# Patient Record
Sex: Male | Born: 1990 | Race: Black or African American | Hispanic: No | Marital: Single | Smoking: Never smoker
Health system: Southern US, Community
[De-identification: ages and names within clinical notes are randomized; demographics above are authoritative.]

---

## 2012-06-01 ENCOUNTER — Emergency Department (HOSPITAL_COMMUNITY): Payer: Self-pay

## 2012-06-01 ENCOUNTER — Encounter (HOSPITAL_COMMUNITY): Payer: Self-pay | Admitting: *Deleted

## 2012-06-01 ENCOUNTER — Emergency Department (HOSPITAL_COMMUNITY)
Admission: EM | Admit: 2012-06-01 | Discharge: 2012-06-01 | Disposition: A | Discharge status: Home / Self Care | Payer: Self-pay | Attending: Emergency Medicine | Admitting: Emergency Medicine

## 2012-06-01 DIAGNOSIS — S8990XA Unspecified injury of unspecified lower leg, initial encounter: Secondary | ICD-10-CM | POA: Insufficient documentation

## 2012-06-01 DIAGNOSIS — S99919A Unspecified injury of unspecified ankle, initial encounter: Secondary | ICD-10-CM | POA: Insufficient documentation

## 2012-06-01 DIAGNOSIS — M25562 Pain in left knee: Secondary | ICD-10-CM

## 2012-06-01 DIAGNOSIS — W010XXA Fall on same level from slipping, tripping and stumbling without subsequent striking against object, initial encounter: Secondary | ICD-10-CM | POA: Insufficient documentation

## 2012-06-01 DIAGNOSIS — Y9361 Activity, american tackle football: Secondary | ICD-10-CM | POA: Insufficient documentation

## 2012-06-01 MED ORDER — IBUPROFEN 400 MG PO TABS
800.0000 mg | ORAL_TABLET | Freq: Once | ORAL | Status: AC
  Administered 2012-06-01: 800 mg via ORAL
  Filled 2012-06-01: qty 2

## 2012-06-01 NOTE — Progress Notes (Signed)
Orthopedic Tech Progress Note Patient Details:  Allen Singleton 09/18/1991 [id number]  Ortho Devices Type of Ortho Device: Crutches Ortho Device/Splint Interventions: Application   Cammer, Mickie Bail 06/01/2012, 11:55 AM

## 2012-06-01 NOTE — ED Provider Notes (Signed)
History   This chart was scribed for Gerhard Munch, MD by Charolett Bumpers . The patient was seen in room TR05C/TR05C. Patient's care was started at 11:02.    CSN: [id number]  Arrival date & time 06/01/12  1017   First MD Initiated Contact with Patient 06/01/12 1102      Chief Complaint  Patient presents with  . Knee Pain    (Consider location/radiation/quality/duration/timing/severity/associated sxs/prior treatment) HPI Allen Singleton is a 21 y.o. male who presents to the Emergency Department complaining of constant, moderate left knee pain with an onset of yesterday. Pt states that he was playing football yesterday, when he went down he heard a popping noise in his left knee. Pt states that his left knee buckled to the left side. Pt states that he was able to ambulate but is aggravated with walking. Pt states that he has tried nothing for the pain. Pt denies any other injuries or complaints of pain at this time. Pt states that he is otherwise healthy. Pt denies any prior medical problems or chronic illnesses.   History reviewed. No pertinent past medical history.  History reviewed. No pertinent past surgical history.  History reviewed. No pertinent family history.  History  Substance Use Topics  . Smoking status: Not on file  . Smokeless tobacco: Not on file  . Alcohol Use: No      Review of Systems  Constitutional:       Per HPI, otherwise negative  HENT:       Per HPI, otherwise negative  Eyes: Negative.   Respiratory:       Per HPI, otherwise negative  Cardiovascular:       Per HPI, otherwise negative  Gastrointestinal: Negative for vomiting.  Genitourinary: Negative.   Musculoskeletal: Positive for joint swelling and arthralgias.       Per HPI, otherwise negative  Skin: Negative.   Neurological: Negative for syncope.    Allergies  Review of patient's allergies indicates no known allergies.  Home Medications  No current outpatient prescriptions on  file.  BP 134/76  Pulse 74  Temp 98.2 F (36.8 C) (Oral)  Resp 16  SpO2 99%  Physical Exam  Nursing note and vitals reviewed. Constitutional: He is oriented to person, place, and time. He appears well-developed. No distress.  HENT:  Head: Normocephalic and atraumatic.  Eyes: Conjunctivae and EOM are normal.  Cardiovascular: Normal rate and regular rhythm.   Pulmonary/Chest: Effort normal. No stridor. No respiratory distress.  Abdominal: He exhibits no distension.  Musculoskeletal: Normal range of motion. He exhibits edema and tenderness.       No joint line tenderness on medial left knee. Joint line tenderness laterally on left knee. Extension and flexion of left knee intact. 6 cm area of edema medially on left knee, not erythematous. Axial joint tenderness. Knee is grossly stable.   Neurological: He is alert and oriented to person, place, and time.  Skin: Skin is warm and dry.  Psychiatric: He has a normal mood and affect.    ED Course  Procedures (including critical care time)  DIAGNOSTIC STUDIES:   COORDINATION OF CARE:  11:16-.Discussed planned course of treatment with the patient including Ibuprofen and f/u with orthopedics, who is agreeable at this time.   11:30-Medication Orders: Ibuprofen (Advil, Motrin) tablet 800 mg-once  11:31-Recheck: Informed pt of imaging results and discussed f/u with orthopedics and at home treatment with ice packs, Ibuprofen and elevation. Pt requests a work note. Discussed planned d/c, pt is agreeable  with plan.   Labs Reviewed - No data to display Dg Knee Complete 4 Views Left  06/01/2012  *RADIOLOGY REPORT*  Clinical Data: Knee pain  LEFT KNEE - COMPLETE 4+ VIEW  Comparison: None.  Findings: Four views of the left knee submitted.  No acute fracture or subluxation.  No joint effusion.  No radiopaque foreign body.  IMPRESSION: No acute fracture or subluxation.  Original Report Authenticated By: Natasha Mead, M.D.     No diagnosis  found.    MDM  I personally performed the services described in this documentation, which was scribed in my presence. The recorded information has been reviewed and considered.   This generally well young male presents one day after sustaining a knee injury playing football.  On exam he is in no distress.  The patient's exam was reassuring with demonstration of a stable knee, with appropriate pulses and sensation distally.  Pus, there is low suspicion for transient dislocation, and his x-ray did not demonstrate acute fracture.  The patient's condition is likely secondary to sprain versus cartilage or ligamentous disruption.  He was discharged in stable condition to follow up with orthopedics.    Gerhard Munch, MD 06/01/12 1224

## 2012-06-01 NOTE — ED Notes (Signed)
Pt reports playing football last night and now having left knee pain.

## 2013-07-23 ENCOUNTER — Encounter (HOSPITAL_COMMUNITY): Payer: Self-pay | Admitting: *Deleted

## 2013-07-23 ENCOUNTER — Emergency Department (HOSPITAL_COMMUNITY)
Admission: EM | Admit: 2013-07-23 | Discharge: 2013-07-23 | Disposition: A | Discharge status: Home / Self Care | Payer: Self-pay | Attending: Emergency Medicine | Admitting: Emergency Medicine

## 2013-07-23 DIAGNOSIS — Z113 Encounter for screening for infections with a predominantly sexual mode of transmission: Secondary | ICD-10-CM | POA: Insufficient documentation

## 2013-07-23 DIAGNOSIS — R3915 Urgency of urination: Secondary | ICD-10-CM | POA: Insufficient documentation

## 2013-07-23 DIAGNOSIS — R51 Headache: Secondary | ICD-10-CM | POA: Insufficient documentation

## 2013-07-23 DIAGNOSIS — R300 Dysuria: Secondary | ICD-10-CM | POA: Insufficient documentation

## 2013-07-23 DIAGNOSIS — Z711 Person with feared health complaint in whom no diagnosis is made: Secondary | ICD-10-CM

## 2013-07-23 DIAGNOSIS — R369 Urethral discharge, unspecified: Secondary | ICD-10-CM | POA: Insufficient documentation

## 2013-07-23 LAB — URINALYSIS, ROUTINE W REFLEX MICROSCOPIC
Ketones, ur: NEGATIVE mg/dL
Nitrite: NEGATIVE
Specific Gravity, Urine: 1.024 (ref 1.005–1.030)
pH: 7.5 (ref 5.0–8.0)

## 2013-07-23 LAB — URINE MICROSCOPIC-ADD ON

## 2013-07-23 MED ORDER — LIDOCAINE HCL (PF) 1 % IJ SOLN
2.0000 mL | Freq: Once | INTRAMUSCULAR | Status: AC
  Administered 2013-07-23: 2 mL

## 2013-07-23 MED ORDER — IBUPROFEN 800 MG PO TABS
800.0000 mg | ORAL_TABLET | Freq: Once | ORAL | Status: AC
  Administered 2013-07-23: 800 mg via ORAL
  Filled 2013-07-23: qty 1

## 2013-07-23 MED ORDER — AZITHROMYCIN 250 MG PO TABS
1000.0000 mg | ORAL_TABLET | Freq: Once | ORAL | Status: AC
  Administered 2013-07-23: 1000 mg via ORAL
  Filled 2013-07-23: qty 4

## 2013-07-23 MED ORDER — LIDOCAINE HCL (PF) 1 % IJ SOLN
INTRAMUSCULAR | Status: AC
  Administered 2013-07-23: 2 mL
  Filled 2013-07-23: qty 5

## 2013-07-23 MED ORDER — CEFTRIAXONE SODIUM 250 MG IJ SOLR
250.0000 mg | Freq: Once | INTRAMUSCULAR | Status: AC
  Administered 2013-07-23: 250 mg via INTRAMUSCULAR
  Filled 2013-07-23: qty 250

## 2013-07-23 NOTE — ED Notes (Signed)
Pt presents with a headache for the past 2 days that comes and goes- states it is hurting on both sides.  Pt also admits to frequent and painful urination- also admits to discharge from his penis for the past 2 days.  Neuro exam negative- alert and oriented X 4.

## 2013-07-23 NOTE — ED Notes (Signed)
Headache for 2 days.  No previous history.  No meds taken for the same

## 2013-07-23 NOTE — ED Provider Notes (Signed)
CSN: [id number]     Arrival date & time 07/23/13  1740 History   First MD Initiated Contact with Patient 07/23/13 2056     Chief Complaint  Patient presents with  . Headache   (Consider location/radiation/quality/duration/timing/severity/associated sxs/prior Treatment) The history is provided by the patient and medical records. No language interpreter was used.    Allen Singleton is a 22 y.o. male  with no known medical Hx presents to the Emergency Department complaining of gradual, persistent, progressively worsening headache onset 2 days ago. Pt describes the pain as throbbing, bitemporal, rated at a 7/10, and nonradiating.  Pt reports that light makes his headache worse and nothing makes it better.  He has not tried to take anything for his headache.  Pt reports a hx of migraine headache and today's symptoms are the same.  He reports that he usually takes tylenol for this but did not feel like trying any this time. He has associated "hot flashes" but has not measured his temperature or has chills.   Pt denies fever, chills, headache, neck pain, chest pain, SOB, palpitations, abd pain, N/V/D, weakness, dizziness, syncope.     Pt has had 3 sexual partners in the last 6 mos, all male.  Pt has not used a condom recently and after a new sexual encounter he began to have dysuria and penile discharge.  Pt denies hematuria, testicular or penile pain.  Pt reports discharge is clear.     History reviewed. No pertinent past medical history. History reviewed. No pertinent past surgical history. No family history on file. History  Substance Use Topics  . Smoking status: Never Smoker   . Smokeless tobacco: Not on file  . Alcohol Use: No    Review of Systems  Constitutional: Negative for fever, diaphoresis, appetite change, fatigue and unexpected weight change.  HENT: Negative for mouth sores and neck stiffness.   Eyes: Negative for visual disturbance.  Respiratory: Negative for cough, chest  tightness, shortness of breath and wheezing.   Cardiovascular: Negative for chest pain.  Gastrointestinal: Negative for nausea, vomiting, abdominal pain, diarrhea and constipation.  Endocrine: Negative for polydipsia, polyphagia and polyuria.  Genitourinary: Positive for dysuria, urgency and discharge. Negative for frequency, hematuria, decreased urine volume, penile swelling, scrotal swelling, penile pain and testicular pain.  Musculoskeletal: Negative for back pain.  Skin: Negative for rash.  Allergic/Immunologic: Negative for immunocompromised state.  Neurological: Positive for headaches. Negative for syncope and light-headedness.  Hematological: Does not bruise/bleed easily.  Psychiatric/Behavioral: Negative for sleep disturbance. The patient is not nervous/anxious.     Allergies  Review of patient's allergies indicates no known allergies.  Home Medications  No current outpatient prescriptions on file. BP 142/91  Pulse 54  Temp(Src) 97.5 F (36.4 C) (Oral)  Resp 16  SpO2 100% Physical Exam  Nursing note and vitals reviewed. Constitutional: He is oriented to person, place, and time. He appears well-developed and well-nourished. No distress.  HENT:  Head: Normocephalic and atraumatic.  Right Ear: Hearing, tympanic membrane, external ear and ear canal normal.  Left Ear: Hearing, tympanic membrane, external ear and ear canal normal.  Nose: Nose normal. No mucosal edema or rhinorrhea. Right sinus exhibits no maxillary sinus tenderness and no frontal sinus tenderness. Left sinus exhibits no maxillary sinus tenderness and no frontal sinus tenderness.  Mouth/Throat: Uvula is midline, oropharynx is clear and moist and mucous membranes are normal. Mucous membranes are not dry. No edematous. No oropharyngeal exudate, posterior oropharyngeal edema, posterior oropharyngeal erythema or tonsillar abscesses.  Eyes: Conjunctivae and EOM are normal. Pupils are equal, round, and reactive to light.  No scleral icterus.  Neck: Normal range of motion. Neck supple.  Cardiovascular: Normal rate, regular rhythm, normal heart sounds and intact distal pulses.   No murmur heard. Pulmonary/Chest: Effort normal and breath sounds normal. No respiratory distress. He has no wheezes. He has no rales. He exhibits no tenderness.  Abdominal: Soft. Bowel sounds are normal. He exhibits no distension. There is no tenderness. There is no rebound and no guarding. Hernia confirmed negative in the right inguinal area and confirmed negative in the left inguinal area.  Genitourinary: Testes normal. Right testis shows no mass, no swelling and no tenderness. Right testis is descended. Cremasteric reflex is not absent on the right side. Left testis shows no mass, no swelling and no tenderness. Left testis is descended. Cremasteric reflex is not absent on the left side. Circumcised. No phimosis, paraphimosis, hypospadias, penile erythema or penile tenderness. Discharge (thick, green, malodorous) found.  Musculoskeletal: Normal range of motion. He exhibits no edema and no tenderness.  Lymphadenopathy:    He has no cervical adenopathy.       Right: No inguinal adenopathy present.       Left: No inguinal adenopathy present.  Neurological: He is alert and oriented to person, place, and time. He has normal reflexes. No cranial nerve deficit. He exhibits normal muscle tone. Coordination normal.  Speech is clear and goal oriented, follows commands Cranial nerves III - XII without deficit, no facial droop Normal strength in upper and lower extremities bilaterally, strong and equal grip strength Sensation normal to light and sharp touch Moves extremities without ataxia, coordination intact Normal finger to nose and rapid alternating movements Neg romberg, no pronator drift Normal gait Normal heel-shin and balance   Skin: Skin is warm and dry. No rash noted. He is not diaphoretic. No erythema.  Psychiatric: He has a normal  mood and affect. His behavior is normal. Judgment and thought content normal.    ED Course  Procedures (including critical care time) Labs Review Labs Reviewed  URINALYSIS, ROUTINE W REFLEX MICROSCOPIC - Abnormal; Notable for the following:    APPearance TURBID (*)    Leukocytes, UA MODERATE (*)    All other components within normal limits  URINE MICROSCOPIC-ADD ON - Abnormal; Notable for the following:    Bacteria, UA MANY (*)    All other components within normal limits  GC/CHLAMYDIA PROBE AMP  URINE CULTURE  RPR  HIV ANTIBODY (ROUTINE TESTING)   Imaging Review No results found.  MDM   1. Concern about STD in male without diagnosis   2. Headache      Darreld Gruszka presents with headache and penile drainage.  Pt HA treated and improved while in ED.  Presentation is like pts typical HA and non concerning for [facility], ICH, Meningitis, or temporal arteritis. Pt is afebrile with no focal neuro deficits, nuchal rigidity, or change in vision. Pt is to follow up with PCP to discuss prophylactic medication; recommend use of tylenol and/or ibuprofen in the future.   Presents for STD screen and penile discharge.  STD cultures obtained including gonorrhea Chlamydia, RPR and HIV. Pt treated prophylactically with Azithromycin and Rocephin.  Patient to be discharged with instructions to follow up with PCP. Discussed importance of using protection when sexually active. Pt understands that they have GC/Chlamydia cultures pending and that they will need to inform all sexual partners if results return positive. I have also discussed reasons to  return immediately to the ER. Patient expresses understanding and agrees with plan.  It has been determined that no acute conditions requiring further emergency intervention are present at this time. The patient/guardian have been advised of the diagnosis and plan. We have discussed signs and symptoms that warrant return to the ED, such as changes or worsening in  symptoms.   Vital signs are stable at discharge.   BP 142/91  Pulse 54  Temp(Src) 97.5 F (36.4 C) (Oral)  Resp 16  SpO2 100%  Patient/guardian has voiced understanding and agreed to follow-up with the PCP or specialist.       Dierdre Forth, PA-C 07/23/13 2227

## 2013-07-24 LAB — HIV ANTIBODY (ROUTINE TESTING W REFLEX): HIV: NONREACTIVE

## 2013-07-24 NOTE — ED Provider Notes (Signed)
Medical screening examination/treatment/procedure(s) were performed by non-physician practitioner and as supervising physician I was immediately available for consultation/collaboration.   Shanna Cisco, MD 07/24/13 [phone]

## 2013-07-25 LAB — URINE CULTURE: Culture: NO GROWTH

## 2013-07-27 ENCOUNTER — Telehealth (HOSPITAL_COMMUNITY): Payer: Self-pay | Admitting: Emergency Medicine

## 2013-07-27 NOTE — ED Notes (Signed)
Patient has +Gonorrhea. °

## 2013-07-27 NOTE — ED Notes (Signed)
+  Gonorrhea. Patient treated with Rocephin and Zithromax. [employer] faxed. 

## 2013-08-01 ENCOUNTER — Telehealth (HOSPITAL_COMMUNITY): Payer: Self-pay | Admitting: Emergency Medicine

## 2013-08-01 NOTE — ED Notes (Signed)
Unable to contact patient via phone. Sent letter. °

## 2014-09-17 ENCOUNTER — Encounter (HOSPITAL_COMMUNITY): Payer: Self-pay | Admitting: *Deleted

## 2014-09-17 ENCOUNTER — Emergency Department (INDEPENDENT_AMBULATORY_CARE_PROVIDER_SITE_OTHER)
Admission: EM | Admit: 2014-09-17 | Discharge: 2014-09-17 | Disposition: A | Discharge status: Home / Self Care | Payer: PRIVATE HEALTH INSURANCE | Source: Home / Self Care | Attending: Family Medicine | Admitting: Family Medicine

## 2014-09-17 DIAGNOSIS — D170 Benign lipomatous neoplasm of skin and subcutaneous tissue of head, face and neck: Secondary | ICD-10-CM

## 2014-09-17 NOTE — ED Provider Notes (Signed)
CSN: [id number]     Arrival date & time 09/17/14  1109 History   First MD Initiated Contact with Patient 09/17/14 1134     Chief Complaint  Patient presents with  . Abscess   (Consider location/radiation/quality/duration/timing/severity/associated sxs/prior Treatment) HPI       23 year old male presents for evaluation of a swollen area on his left ear. This is been present for about 2 months, getting gradually larger. It is now about 7 mm diameter. It is soft, swollen, and nontender. He denies any drainage. He has had something similar to this before, more onto his face, that was very swollen and tender and had to be drained. He denies any systemic symptoms.  History reviewed. No pertinent past medical history. History reviewed. No pertinent past surgical history. History reviewed. No pertinent family history. History  Substance Use Topics  . Smoking status: Never Smoker   . Smokeless tobacco: Not on file  . Alcohol Use: No    Review of Systems  HENT:       See history of present illness regarding lesion on the face  All other systems reviewed and are negative.   Allergies  Review of patient's allergies indicates no known allergies.  Home Medications   Prior to Admission medications   Not on File   BP 138/73 mmHg  Pulse 63  Temp(Src) 98.5 F (36.9 C) (Oral)  Resp 12  SpO2 97% Physical Exam  Constitutional: He is oriented to person, place, and time. He appears well-developed and well-nourished. No distress.  HENT:  Head: Normocephalic.  Ears:  Pulmonary/Chest: Effort normal. No respiratory distress.  Neurological: He is alert and oriented to person, place, and time. Coordination normal.  Skin: Skin is warm and dry. No rash noted. He is not diaphoretic.  Psychiatric: He has a normal mood and affect. Judgment normal.  Nursing note and vitals reviewed.   ED Course  Procedures (including critical care time) Labs Review Labs Reviewed - No data to display  Imaging  Review No results found.   MDM   1. Lipoma of face    Discussed with patient his options, he would like to get this removed. He will be referred to general surgery for removal. Return precautions discussed with the patient.       Liam Graham, PA-C 09/17/14 1144

## 2014-09-17 NOTE — ED Notes (Signed)
Pt  Has  A  Swollen area above the  l  Ear  That  Has  Been there  For several    Months    - he  Reports       He  Has  Had one in past    -he  denys  Any  Other  Symptoms

## 2014-09-17 NOTE — Discharge Instructions (Signed)
Lipoma  A lipoma is a noncancerous (benign) tumor composed of fat cells. They are usually found under the skin (subcutaneous). A lipoma may occur in any tissue of the body that contains fat. Common areas for lipomas to appear include the back, shoulders, buttocks, and thighs. Lipomas are a very common soft tissue growth. They are soft and grow slowly. Most problems caused by a lipoma depend on where it is growing.  DIAGNOSIS   A lipoma can be diagnosed with a physical exam. These tumors rarely become cancerous, but radiographic studies can help determine this for certain. Studies used may include:  · Computerized X-ray scans (CT or CAT scan).  · Computerized magnetic scans (MRI).  TREATMENT   Small lipomas that are not causing problems may be watched. If a lipoma continues to enlarge or causes problems, removal is often the best treatment. Lipomas can also be removed to improve appearance. Surgery is done to remove the fatty cells and the surrounding capsule. Most often, this is done with medicine that numbs the area (local anesthetic). The removed tissue is examined under a microscope to make sure it is not cancerous. Keep all follow-up appointments with your caregiver.  SEEK MEDICAL CARE IF:   · The lipoma becomes larger or hard.  · The lipoma becomes painful, red, or increasingly swollen. These could be signs of infection or a more serious condition.  Document Released: 10/13/2002 Document Revised: 01/15/2012 Document Reviewed: 03/25/2010  Exit[employer]® Patient Information ©2015 [employer]. This information is not intended to replace advice given to you by your health care provider. Make sure you discuss any questions you have with your health care provider.

## 2015-04-09 ENCOUNTER — Encounter: Payer: Self-pay | Admitting: Emergency Medicine

## 2015-04-09 ENCOUNTER — Ambulatory Visit (INDEPENDENT_AMBULATORY_CARE_PROVIDER_SITE_OTHER): Payer: Worker's Compensation | Admitting: Emergency Medicine

## 2015-04-09 ENCOUNTER — Ambulatory Visit: Payer: Worker's Compensation

## 2015-04-09 VITALS — BP 122/60 | Temp 98.7°F | Resp 17 | Ht 69.0 in | Wt 203.0 lb

## 2015-04-09 DIAGNOSIS — M25572 Pain in left ankle and joints of left foot: Secondary | ICD-10-CM

## 2015-04-09 MED ORDER — NAPROXEN SODIUM 550 MG PO TABS: 550.0000 mg | ORAL_TABLET | Freq: Two times a day (BID) | ORAL | Status: DC

## 2015-04-09 NOTE — Patient Instructions (Signed)

## 2015-04-09 NOTE — Progress Notes (Signed)
Allen Singleton Mar 03, 1991 24 y.o.   Chief Complaint  Patient presents with  . Ankle Pain    heavy door hit nkle last night at work  . Ankle Injury    swollen     Date of Injury: 04/08/2015  History of Present Illness:  Presents for evaluation of work-related complaint. He stated that last night while at work a door closed on its left ankle and heel causing him to have suffered an inversion injury. He said that he felt and heard a pop in his ankle. Since that time has been unable to bear weight comfortably. He is experiencing some swelling and tenderness of the ankle and medial forefoot.  Said the pain is diminished by elevation and cold application.  He's had no improvement with over-the-counter medication.  ROS  Review of Systems  Constitutional: Negative for fever, chills and fatigue.  HENT: Negative for congestion, ear pain, hearing loss, postnasal drip, rhinorrhea and sinus pressure.   Eyes: Negative for discharge and redness.  Respiratory: Negative for cough, shortness of breath and wheezing.   Cardiovascular: Negative for chest pain and leg swelling.  Gastrointestinal: Negative for nausea, vomiting, abdominal pain, constipation and blood in stool.  Genitourinary: Negative for dysuria, urgency and frequency.  Musculoskeletal: Negative for neck stiffness.  Skin: Negative for rash.  Neurological: Negative for seizures, weakness and headaches.    No Known Allergies   Current medications reviewed and updated. Past medical history, family history, social history have been reviewed and updated.   Physical Exam  Constitutional: He is well-developed, well-nourished, and in no distress.  HENT:  Head: Normocephalic and atraumatic.  Eyes: Conjunctivae are normal. Pupils are equal, round, and reactive to light.  Neck: Normal range of motion. Neck supple.  Cardiovascular: Normal rate and regular rhythm.   Pulmonary/Chest: Breath sounds normal. No respiratory distress.   Abdominal: Soft.  Musculoskeletal:       Left ankle: He exhibits swelling and deformity. He exhibits normal range of motion, no ecchymosis and normal pulse. Tenderness. Lateral malleolus tenderness found. Achilles tendon normal.     Assessment and Plan:   [facility] reading (PRIMARY) by  Dr. Ouida Sills. Negative foot and ankle.  Soft tissue injury the ankle.   He was placed on Anaprox for pain. He'll elevate and ice his ankle as needed as able He was given a work note for light duty. He'll follow-up in a week.Marland Kitchen He was placed in a walker boot.

## 2015-04-15 ENCOUNTER — Ambulatory Visit (INDEPENDENT_AMBULATORY_CARE_PROVIDER_SITE_OTHER): Payer: Worker's Compensation | Admitting: Emergency Medicine

## 2015-04-15 VITALS — BP 118/70 | HR 55 | Temp 98.6°F | Resp 17 | Ht 69.5 in | Wt 152.4 lb

## 2015-04-15 DIAGNOSIS — M25572 Pain in left ankle and joints of left foot: Secondary | ICD-10-CM

## 2015-04-15 MED ORDER — IBUPROFEN 600 MG PO TABS: 600.0000 mg | ORAL_TABLET | Freq: Three times a day (TID) | ORAL | Status: AC | PRN

## 2015-04-15 NOTE — Patient Instructions (Signed)

## 2015-04-15 NOTE — Progress Notes (Signed)
Subjective:  Patient ID: Allen Singleton, male    DOB: Nov 16, 1990  Age: 24 y.o. MRN: [id number]  CC: Follow-up   HPI Lamine Laton presents  . He was injured by a door that closed on his foot a week ago. His interval history is one of improvement although he still complains quite bitterly of pain in the forefoot. His ankle joint is free complaint. He has no swelling or ecchymosis. He never did get his naproxen due to cost issues. He denies any other complaint.  Outpatient Prescriptions Prior to Visit  Medication Sig Dispense Refill  . naproxen sodium (ANAPROX DS) 550 MG tablet Take 1 tablet (550 mg total) by mouth 2 (two) times daily with a meal. (Patient not taking: Reported on 04/15/2015) 40 tablet 0   No facility-administered medications prior to visit.    History   Social History  . Marital Status: Single    Spouse Name: N/A  . Number of Children: N/A  . Years of Education: N/A   Social History Main Topics  . Smoking status: Never Smoker   . Smokeless tobacco: Not on file  . Alcohol Use: No  . Drug Use: No  . Sexual Activity: Not on file   Other Topics Concern  . None   Social History Narrative    History reviewed. No pertinent family history.  History reviewed. No pertinent past medical history.   Review of Systems  Constitutional: Negative for fever, chills and appetite change.  HENT: Negative for congestion, ear pain, postnasal drip, sinus pressure and sore throat.   Eyes: Negative for pain and redness.  Respiratory: Negative for cough, shortness of breath and wheezing.   Cardiovascular: Negative for leg swelling.  Gastrointestinal: Negative for nausea, vomiting, abdominal pain, diarrhea, constipation and blood in stool.  Endocrine: Negative for polyuria.  Genitourinary: Negative for dysuria, urgency, frequency and flank pain.  Musculoskeletal: Negative for gait problem.  Skin: Negative for rash.  Neurological: Negative for weakness and headaches.    Psychiatric/Behavioral: Negative for confusion and decreased concentration. The patient is not nervous/anxious.     Objective:  BP 118/70 mmHg  Pulse 55  Temp(Src) 98.6 F (37 C) (Oral)  Resp 17  Ht 5' 9.5" (1.765 m)  Wt 152 lb 6.4 oz (69.128 kg)  BMI 22.19 kg/m2  SpO2 98%  BP Readings from Last 3 Encounters:  04/15/15 118/70  04/09/15 122/60  09/17/14 138/73    Wt Readings from Last 3 Encounters:  04/15/15 152 lb 6.4 oz (69.128 kg)  04/09/15 203 lb (92.08 kg)    Physical Exam  Constitutional: He is oriented to person, place, and time. He appears well-developed and well-nourished.  HENT:  Head: Normocephalic and atraumatic.  Eyes: Conjunctivae are normal. Pupils are equal, round, and reactive to light.  Pulmonary/Chest: Effort normal.  Musculoskeletal: He exhibits no edema.       Right shoulder: He exhibits tenderness. He exhibits normal range of motion, no swelling and no deformity.  Neurological: He is alert and oriented to person, place, and time.  Skin: Skin is dry.  Psychiatric: He has a normal mood and affect. His behavior is normal. Thought content normal.    No results found for: WBC, HGB, HCT, PLT, GLUCOSE, CHOL, TRIG, HDL, LDLDIRECT, LDLCALC, ALT, AST, NA, K, CL, CREATININE, BUN, CO2, TSH, PSA, INR, GLUF, HGBA1C, MICROALBUR    .  Assessment & Plan:   Nahuel was seen today for follow-up.  Diagnoses and all orders for this visit:  Pain in joint,  ankle and foot, left  Other orders -     ibuprofen (ADVIL,MOTRIN) 600 MG tablet; Take 1 tablet (600 mg total) by mouth every 8 (eight) hours as needed.   I am having Mr. Stenberg start on ibuprofen. I am also having him maintain his naproxen sodium.  Meds ordered this encounter  Medications  . ibuprofen (ADVIL,MOTRIN) 600 MG tablet    Sig: Take 1 tablet (600 mg total) by mouth every 8 (eight) hours as needed.    Dispense:  30 tablet    Refill:  0    Appropriate red flag conditions were discussed with  the patient as well as actions that should be taken.  Patient expressed his understanding.  Follow-up: Return in about 1 week (around 04/22/2015).  Roselee Culver, MD

## 2015-04-22 ENCOUNTER — Ambulatory Visit (INDEPENDENT_AMBULATORY_CARE_PROVIDER_SITE_OTHER): Payer: Worker's Compensation | Admitting: Physician Assistant

## 2015-04-22 VITALS — BP 120/74 | HR 61 | Temp 98.5°F | Resp 16 | Ht 69.5 in | Wt 151.0 lb

## 2015-04-22 DIAGNOSIS — M25572 Pain in left ankle and joints of left foot: Secondary | ICD-10-CM

## 2015-04-22 NOTE — Progress Notes (Signed)
   Subjective:    Patient ID: Allen Singleton, male    DOB: [birth year], 24 y.o.   MRN: [id number]  Chief Complaint  Patient presents with  . Follow-up    W/C Lt. foot injury   Medications, allergies, past medical history, surgical history, family history, social history and problem list reviewed and updated.  HPI  24 yom returns for evaluation for workers comp case.   Seen here 6/3 and 6/9 after sustaining left ankle/forefoot injury at work when door shut on his foot. Xrays neg on 6/3. Was placed in walker boot 6/3 and returned to work light duty. When seen on 6/9 had continued pain so had scheduled ibuprofen added to his naproxen. Was again given return to work with restrictions.   Today he returns without any further pain or issues. Walking and working as normal. No walker boot or brace. Taking naproxen qd.   Review of Systems No fevers, chills.     Objective:   Physical Exam  Constitutional: He is oriented to person, place, and time. He appears well-developed and well-nourished.  Non-toxic appearance. He does not have a sickly appearance. He does not appear ill. No distress.  BP 120/74 mmHg  Pulse 61  Temp(Src) 98.5 F (36.9 C) (Oral)  Resp 16  Ht 5' 9.5" (1.765 m)  Wt 151 lb (68.493 kg)  BMI 21.99 kg/m2  SpO2 98%   Musculoskeletal:       Left ankle: Normal. He exhibits normal range of motion, no swelling and no ecchymosis. No tenderness. Achilles tendon normal.       Left foot: Normal. There is normal range of motion, no tenderness and no bony tenderness.  Neurological: He is alert and oriented to person, place, and time.      Assessment & Plan:   24 yom returns for evaluation for workers comp case.   Pain in joint, ankle and foot, left --normal exam, no further pain or complaints --return to work without restrictions  Julieta Gutting, PA-C Physician Assistant-Certified [facility]  04/22/2015 3:27 PM

## 2015-11-09 IMAGING — CR DG ANKLE COMPLETE 3+V*L*
4 series · 4 of 4 positions shown · non-contrast
Comparison: None.

CLINICAL DATA: Pain after hitting door

EXAM:
LEFT ANKLE COMPLETE - 3+ VIEW

[AP]
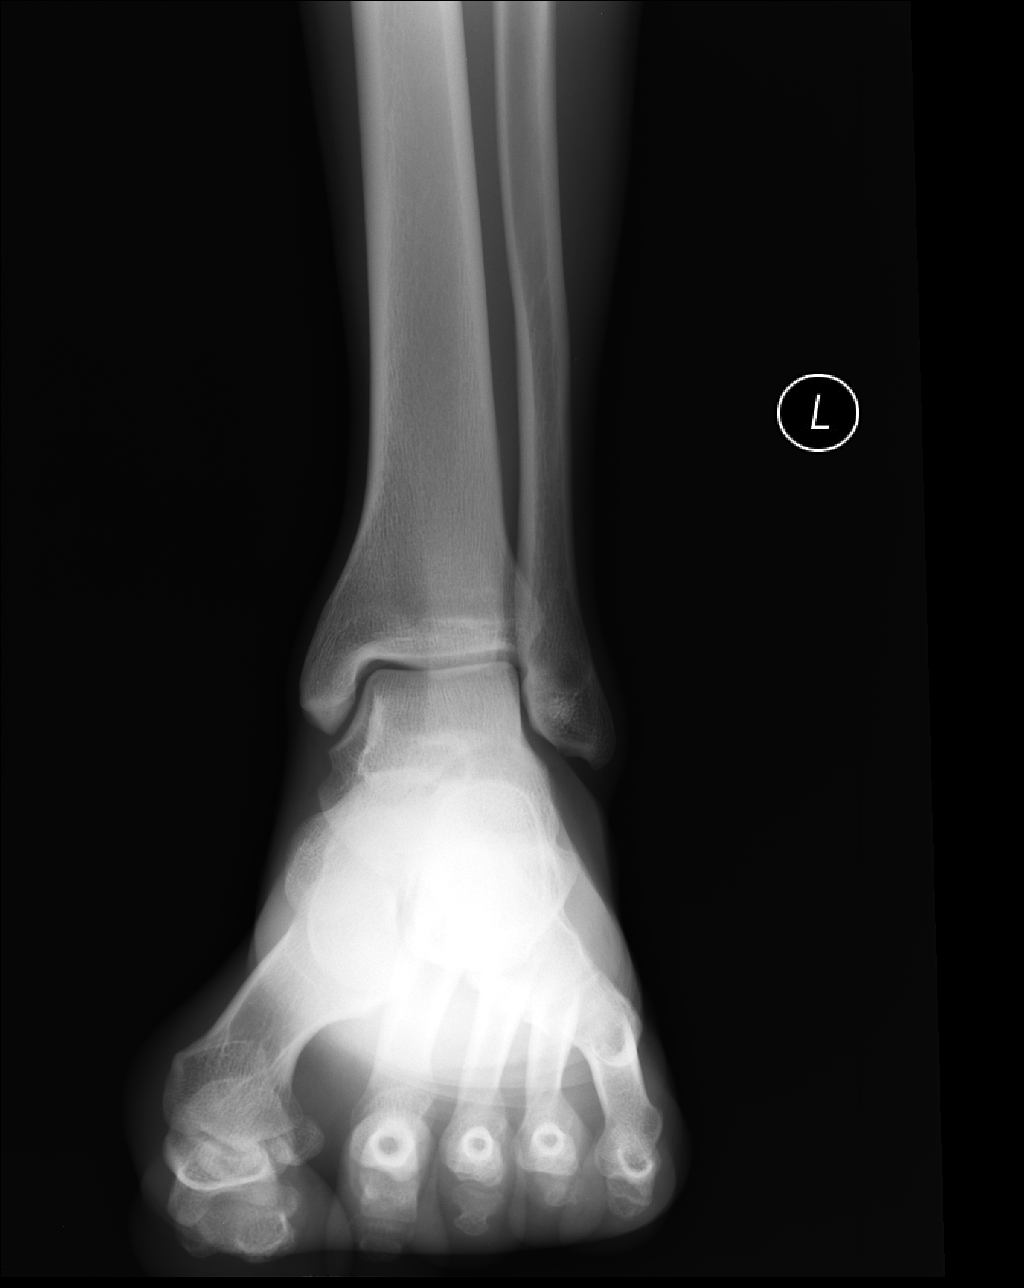

[ap obl int rot]
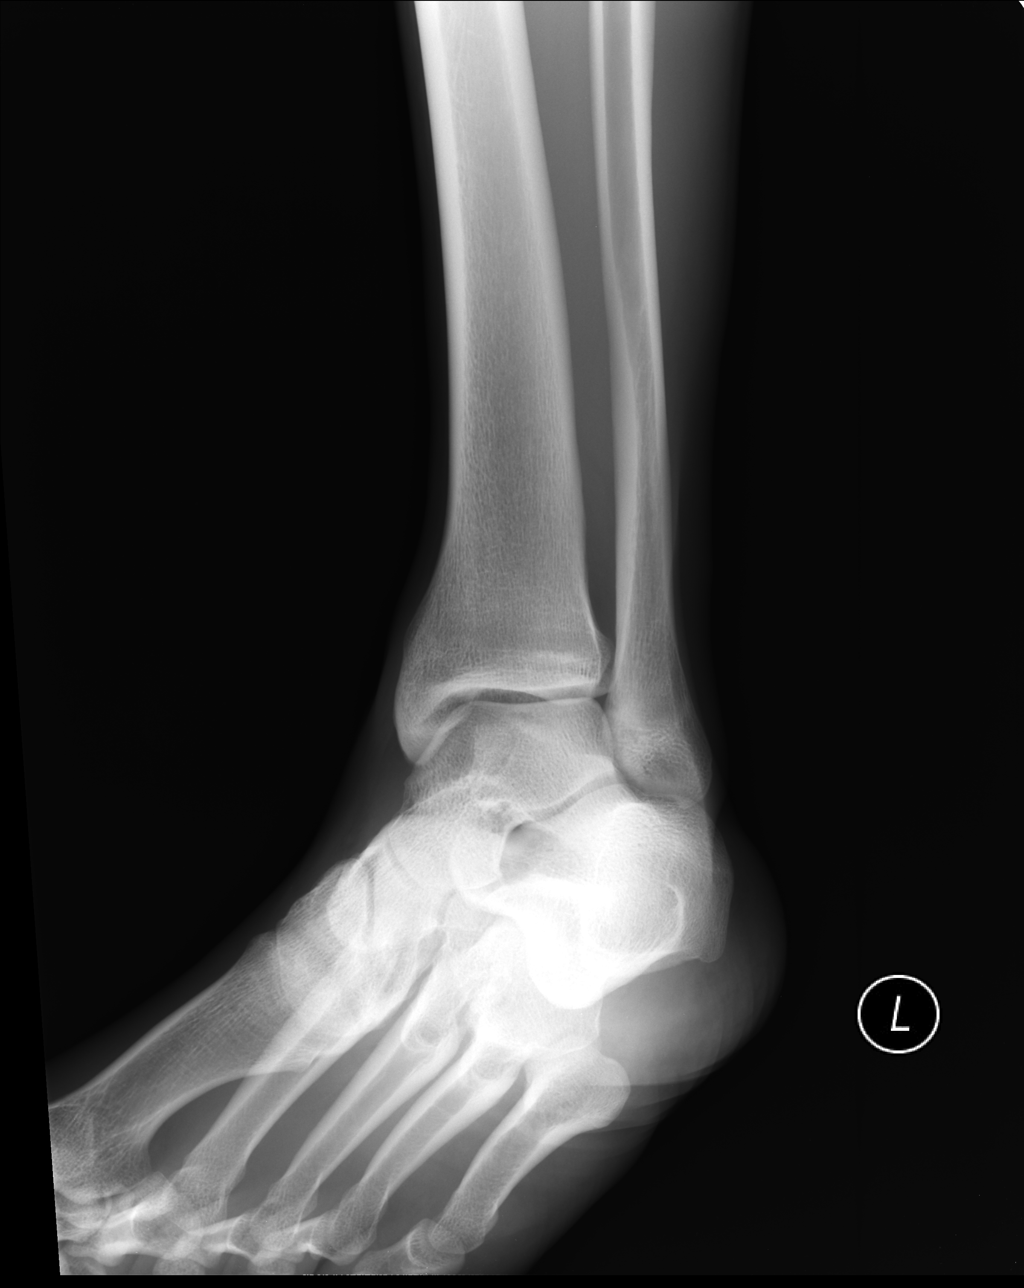

[medial obl]
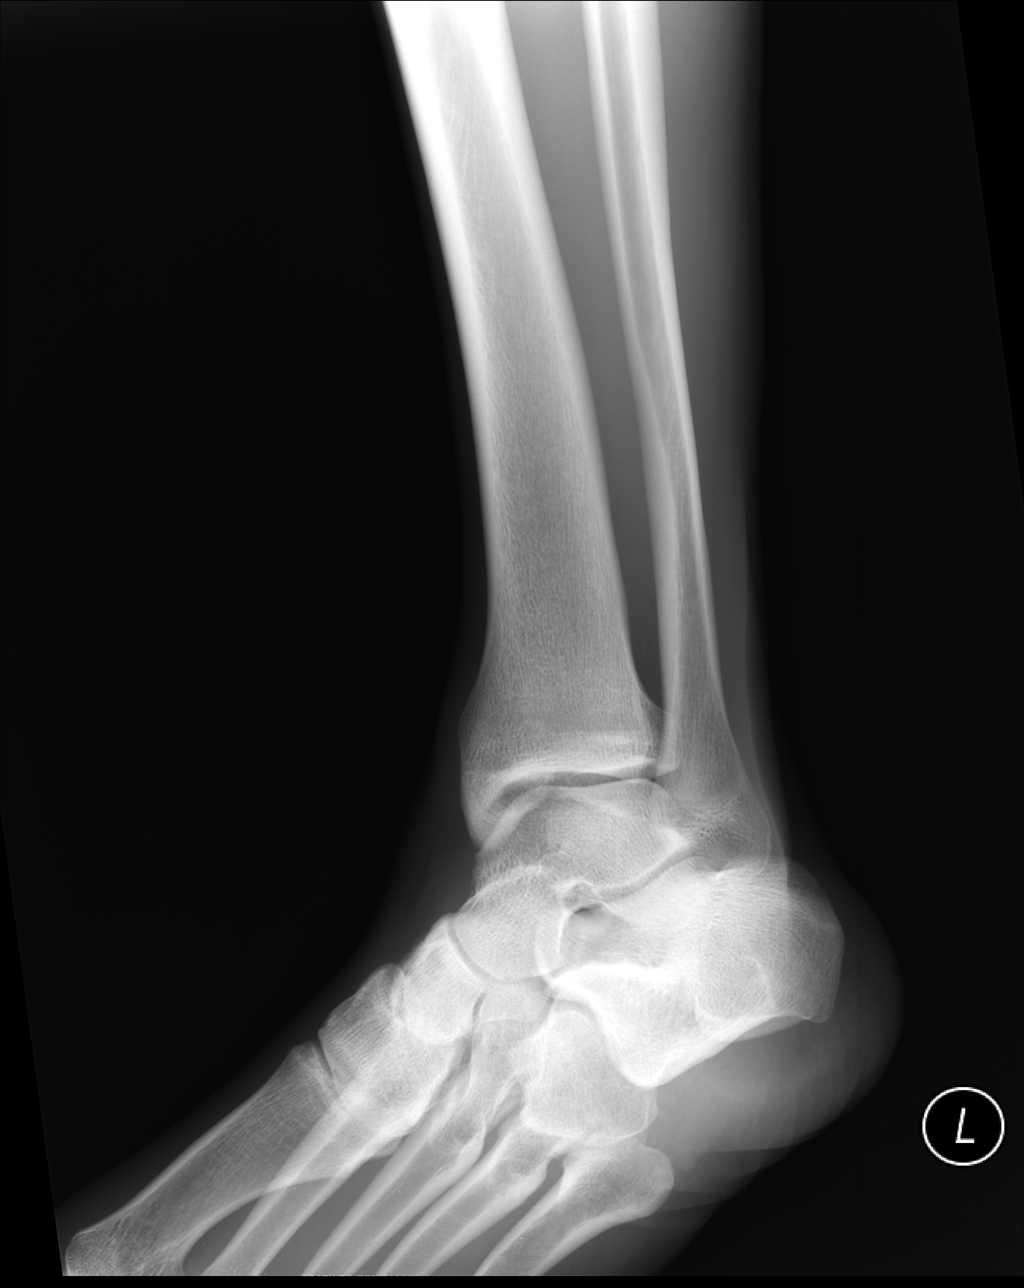

[lateral]
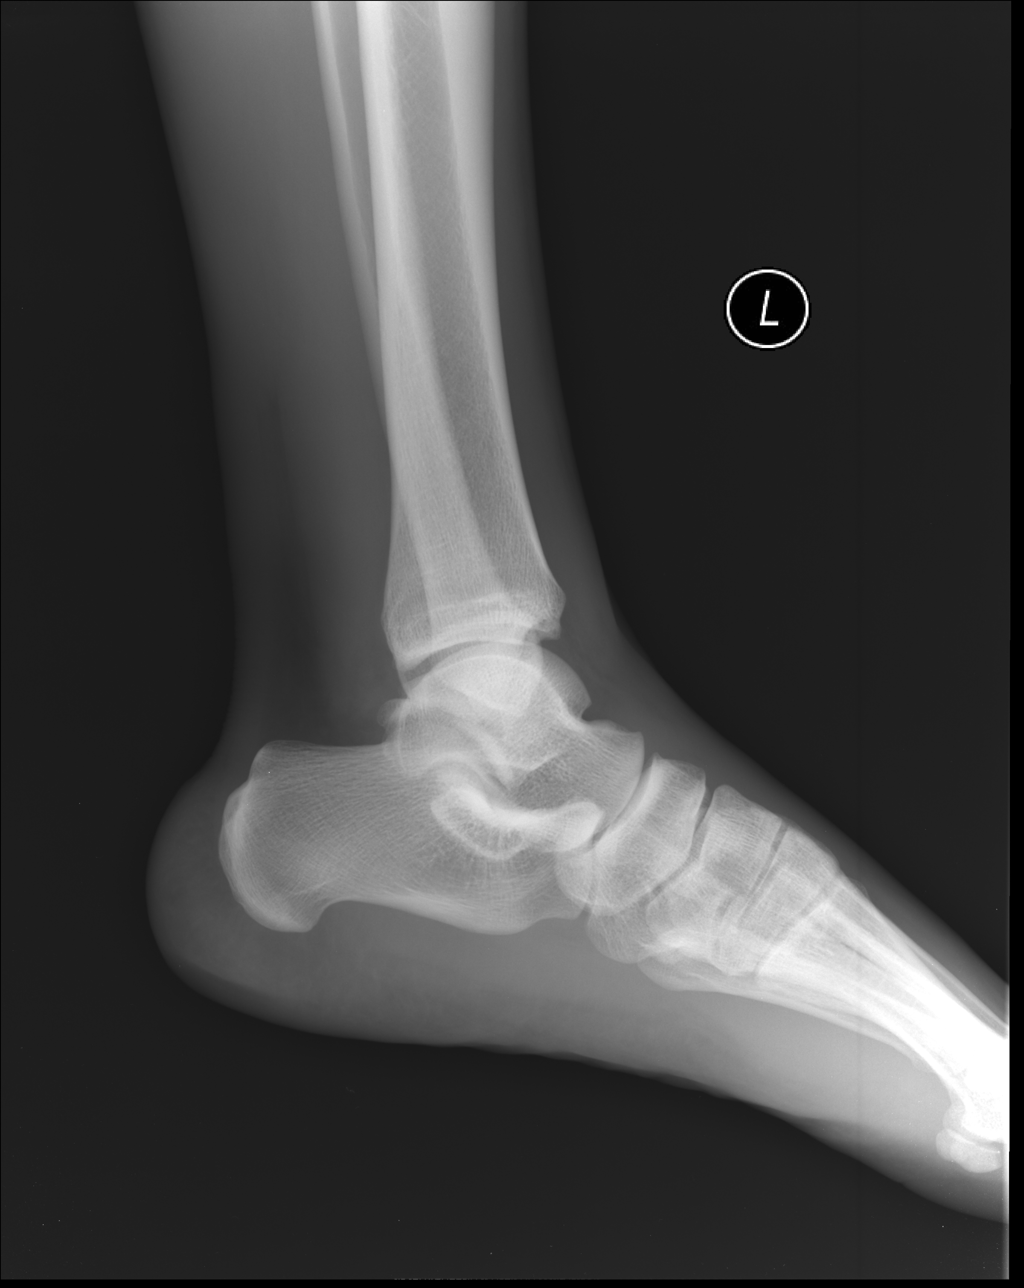

[4 of 4 positions shown; findings below may reference images not displayed]

FINDINGS: Frontal, oblique, and lateral views obtained. No fracture or joint
effusion. Ankle mortise appears intact. No appreciable joint space
narrowing.
IMPRESSION: No fracture.  Mortise intact.
